# Patient Record
Sex: Female | Born: 1955 | Race: White | Hispanic: No | Marital: Married | Smoking: Never smoker
Health system: Southern US, Community
[De-identification: ages and names within clinical notes are randomized; demographics above are authoritative.]

---

## 2016-10-08 ENCOUNTER — Institutional Professional Consult (permissible substitution): Payer: Self-pay | Admitting: Internal Medicine

## 2016-10-17 ENCOUNTER — Other Ambulatory Visit (INDEPENDENT_AMBULATORY_CARE_PROVIDER_SITE_OTHER): Payer: Commercial Managed Care - PPO

## 2016-10-17 ENCOUNTER — Ambulatory Visit (INDEPENDENT_AMBULATORY_CARE_PROVIDER_SITE_OTHER): Payer: Commercial Managed Care - PPO | Admitting: Internal Medicine

## 2016-10-17 ENCOUNTER — Encounter: Payer: Self-pay | Admitting: Internal Medicine

## 2016-10-17 VITALS — BP 116/74 | HR 80 | Temp 98.6°F | Ht 68.0 in | Wt 203.0 lb

## 2016-10-17 DIAGNOSIS — J45991 Cough variant asthma: Secondary | ICD-10-CM

## 2016-10-17 LAB — CBC WITH DIFFERENTIAL/PLATELET
BASOS PCT: 0.8 % (ref 0.0–3.0)
Basophils Absolute: 0.1 10*3/uL (ref 0.0–0.1)
EOS PCT: 2.4 % (ref 0.0–5.0)
Eosinophils Absolute: 0.2 10*3/uL (ref 0.0–0.7)
HEMATOCRIT: 39.5 % (ref 36.0–46.0)
HEMOGLOBIN: 13.4 g/dL (ref 12.0–15.0)
LYMPHS PCT: 24.1 % (ref 12.0–46.0)
Lymphs Abs: 2.4 10*3/uL (ref 0.7–4.0)
MCHC: 34 g/dL (ref 30.0–36.0)
MCV: 89.8 fl (ref 78.0–100.0)
Monocytes Absolute: 0.7 10*3/uL (ref 0.1–1.0)
Monocytes Relative: 7.2 % (ref 3.0–12.0)
Neutro Abs: 6.6 10*3/uL (ref 1.4–7.7)
Neutrophils Relative %: 65.5 % (ref 43.0–77.0)
Platelets: 290 10*3/uL (ref 150.0–400.0)
RBC: 4.39 Mil/uL (ref 3.87–5.11)
RDW: 13.3 % (ref 11.5–15.5)
WBC: 10 10*3/uL (ref 4.0–10.5)

## 2016-10-17 MED ORDER — FAMOTIDINE 20 MG PO TABS: ORAL_TABLET | ORAL | Status: DC

## 2016-10-17 MED ORDER — PREDNISONE 10 MG PO TABS: ORAL_TABLET | ORAL | 0 refills | Status: DC

## 2016-10-17 MED ORDER — TRAMADOL HCL 50 MG PO TABS: ORAL_TABLET | ORAL | 0 refills | Status: DC

## 2016-10-17 MED ORDER — PANTOPRAZOLE SODIUM 40 MG PO TBEC: 40.0000 mg | DELAYED_RELEASE_TABLET | Freq: Every day | ORAL | 2 refills | Status: DC

## 2016-10-17 MED ORDER — AMOXICILLIN-POT CLAVULANATE 875-125 MG PO TABS: 1.0000 | ORAL_TABLET | Freq: Two times a day (BID) | ORAL | 0 refills | Status: AC

## 2016-10-17 NOTE — Patient Instructions (Addendum)
Augmentin 875 mg take one pill twice daily  X 10 days - take at breakfast and supper with large glass of water.  It would help reduce the usual side effects (diarrhea and yeast infections) if you ate cultured yogurt at lunch.   The key to effective treatment for your cough is eliminating the non-stop cycle of cough you're stuck in long enough to let your airway heal completely and then see if there is anything still making you cough once you stop the cough suppression, but this should take no more than 5 days to figure out  First take mucinex dm 1200 mg  every 12 hours and supplement if needed with  tramadol 50 mg up to 2 every 4 hours to suppress the urge to cough at all or even clear your throat. Swallowing water or using ice chips/non mint and menthol containing candies (such as lifesavers or sugarless jolly ranchers) are also effective.  You should rest your voice and avoid activities that you know make you cough.  Once you have eliminated the cough for 3 straight days try reducing the tramadol first,  then the mucinex dm as tolerated.    Prednisone 10 mg take  4 each am x 2 days,   2 each am x 2 days,  1 each am x 2 days and stop (this is to eliminate allergies and inflammation from coughing)  Protonix (pantoprazole) Take 30-60 min before first meal of the day and Pepcid 20 mg one bedtime plus chlorpheniramine 4 mg x 2 at bedtime (both available over the counter)  until cough is completely gone for at least a week without the need for cough suppression   Prednisone 10 mg take  4 each am x 2 days,   2 each am x 2 days,  1 each am x 2 days and stop   GERD (REFLUX)  is an extremely common cause of respiratory symptoms, many times with no significant heartburn at all.    It can be treated with medication, but also with lifestyle changes including avoidance of late meals, excessive alcohol, smoking cessation, and avoid fatty foods, chocolate, peppermint, colas, red wine, and acidic juices such as  orange juice.  NO MINT OR MENTHOL PRODUCTS SO NO COUGH DROPS   USE HARD CANDY INSTEAD (jolley ranchers or Stover's or Lifesavers (all available in sugarless versions) NO OIL BASED VITAMINS - use powdered substitutes.   Please remember to go to the lab department downstairs in the basement  for your tests - we will call you with the results when they are available.   Please schedule a follow up office visit in 2 weeks, sooner if needed  with all medications /inhalers/ solutions in hand so we can verify exactly what you are taking. This includes all medications from all doctors and over the counters

## 2016-10-17 NOTE — Progress Notes (Signed)
Subjective:     Patient ID: Alyssa [name], female   DOB: 01/11/1955,     MRN: [id number]  HPI  6 yowf never smoker  remembers having to make trips to ER for treatments for breathing treatments, did not see specialist and did fine in HS swimming/ boot camp s inhalers with new problems with stuffy head esp winter in mid 90's come and go and end up with bad coughing  Most years and started up again Spring 2018 and persisted ever since so referred to pulmonary clinic 10/17/2016 by Dr   Clint Guy     10/17/2016 1st  Pulmonary office visit/ Alyssa [name]   Chief Complaint  Patient presents with  . Pulmonary Consult    Referred by Dr. Leonor Liv. Pt c/o cough since April 2018- prod with dark green sputum, occ with blood streaked. Cough "comes in spells" and can be triggered by turning over in bed. She coughs until loses urinary continence and also until she is vomiting at times.   cough came on acutely in April 2018 present daily since and worse mid morning, some better in late afternoon and best rx was neb in office and multiple ov's and gets severe to point where coughs/gags/pees and sleeps on a couple of pillows on back Onset was when increased exp to father's house as of March 2018  Nose is stuffy/ congested and clear mucus > no allergy/ sinus eval since onset / no resp to abx/ symbicort   Unless coughing Not limited by breathing from desired activities     No obvious day to day or daytime variability or assoc excess/ purulent sputum or mucus plugs or hemoptysis or cp or chest tightness, subjective wheeze or overt   hb symptoms. No unusual exp hx or h/o childhood pna or knowledge of premature birth.  Sleeping ok flat without nocturnal  or early am exacerbation  of respiratory  c/o's or need for noct saba. Also denies any obvious fluctuation of symptoms with weather or environmental changes or other aggravating or alleviating factors except as outlined above   Current Allergies, Complete Past Medical  History, Past Surgical History, Family History, and Social History were reviewed in [employer] record.  ROS  The following are not active complaints unless bolded Hoarseness, sore throat, dysphagia, dental problems, itching, sneezing,  nasal congestion or discharge of excess mucus or purulent secretions, ear ache,   fever, chills, sweats, unintended wt loss or wt gain, classically pleuritic or exertional cp,  orthopnea pnd or leg swelling, presyncope, palpitations, abdominal pain, anorexia, nausea, vomiting, diarrhea  or change in bowel habits or change in bladder habits= incont with cough , change in stools or change in urine, dysuria, hematuria,  rash, arthralgias, visual complaints, headache, numbness, weakness or ataxia or problems with walking or coordination,  change in mood/affect or memory.        Current Meds  Medication Sig  . atenolol (TENORMIN) 25 MG tablet Take 25 mg by mouth daily.  Marland Kitchen atorvastatin (LIPITOR) 80 MG tablet Take 80 mg by mouth daily.  . hydrochlorothiazide (HYDRODIURIL) 25 MG tablet Take 25 mg by mouth daily.  Marland Kitchen loratadine (CLARITIN) 10 MG tablet Take 10 mg by mouth daily.             Review of Systems     Objective:   Physical Exam    amb pleasant hoarse wf nad   Wt Readings from Last 3 Encounters:  10/17/16 203 lb (92.1 kg)    Vital signs  reviewed  - Note on arrival 02 sats  96% on RA     HEENT: nl dentition,  and oropharynx. Nl external ear canals without cough reflex - moderate bilateral non-specific turbinate edema     NECK :  without JVD/Nodes/TM/ nl carotid upstrokes bilaterally   LUNGS: no acc muscle use,  Nl contour chest with severe coughing paroxysms on exp > insp  And mostly pseudowheezing    CV:  RRR  no s3 or murmur or increase in P2, and no edema   ABD:  soft and nontender with nl inspiratory excursion in the supine position. No bruits or organomegaly appreciated, bowel sounds nl  MS:  Nl gait/ ext warm  without deformities, calf tenderness, cyanosis or clubbing No obvious joint restrictions   SKIN: warm and dry without lesions    NEURO:  alert, approp, nl sensorium with  no motor or cerebellar deficits apparent.      I personally reviewed images and agree with radiology impression as follows:  CXR:   08/19/16  wnl    Labs ordered 10/17/2016  Allergy profile   Assessment:

## 2016-10-17 NOTE — Assessment & Plan Note (Addendum)
The most common causes of chronic cough in immunocompetent adults include the following: upper airway cough syndrome (UACS), previously referred to as postnasal drip syndrome (PNDS), which is caused by variety of rhinosinus conditions; (2) asthma; (3) GERD; (4) chronic bronchitis from cigarette smoking or other inhaled environmental irritants; (5) nonasthmatic eosinophilic bronchitis; and (6) bronchiectasis.   These conditions, singly or in combination, have accounted for up to 94% of the causes of chronic cough in prospective studies.   Other conditions have constituted no >6% of the causes in prospective studies These have included bronchogenic carcinoma, chronic interstitial pneumonia, sarcoidosis, left ventricular failure, ACEI-induced cough, and aspiration from a condition associated with pharyngeal dysfunction.    Chronic cough is often simultaneously caused by more than one condition. A single cause has been found from 38 to 82% of the time, multiple causes from 18 to 62%. Multiply caused cough has been the result of three diseases up to 42% of the time.       Most likely this is either cough variant asthma and/or Upper airway cough syndrome (previously labeled PNDS),  is so named because it's frequently impossible to sort out how much is  CR/sinusitis with freq throat clearing (which can be related to primary GERD)   vs  causing  secondary (" extra esophageal")  GERD from wide swings in gastric pressure that occur with throat clearing, often  promoting self use of mint and menthol lozenges that reduce the lower esophageal sphincter tone and exacerbate the problem further in a cyclical fashion.   These are the same pts (now being labeled as having "irritable larynx syndrome" by some cough centers) who not infrequently have a history of having failed to tolerate ace inhibitors,  dry powder inhalers or biphosphonates or report having atypical/extraesophageal reflux symptoms that don't respond to  standard doses of PPI  and are easily confused as having aecopd or asthma flares by even experienced allergists/ pulmonologists (myself included).   Of the three most common causes of  Sub-acute or recurrent or chronic cough, only one (GERD)  can actually contribute to/ trigger  the other two (asthma and post nasal drip syndrome)  and perpetuate the cylce of cough.  While not intuitively obvious, many patients with chronic low grade reflux do not cough until there is a primary insult that disturbs the protective epithelial barrier and exposes sensitive nerve endings.   This is typically viral but can be direct physical injury such as with an endotracheal tube.   The point is that once this occurs, it is difficult to eliminate the cycle  using anything but a maximally effective acid suppression regimen at least in the short run, accompanied by an appropriate diet to address non acid GERD and control / eliminate the cough itself for at least 3 days.     REC  tramadol to eliminate the cyclical cough/ gerd while rx possible sinus dz with augmentin and control airways inflammation with prednisone and return in 2 weeks to regroup with all meds in hand using a trust but verify approach to confirm accurate Medication  Reconciliation The principal here is that until we are certain that the  patients are doing what we've asked, it makes no sense to ask them to do more.    Total time devoted to counseling  > 50 % of initial 60 min office visit:  review case with pt/ discussion of options/alternatives/ personally creating written customized instructions  in presence of pt  then going over those specific  Instructions directly with the pt including how to use all of the meds but in particular covering each new medication in detail and the difference between the maintenance= "automatic" meds and the prns using an action plan format for the latter (If this problem/symptom => do that organization reading Left to  right).  Please see AVS from this visit for a full list of these instructions which I personally wrote for this pt and  are unique to this visit.

## 2016-10-18 LAB — RESPIRATORY ALLERGY PROFILE REGION II ~~LOC~~
Allergen, Cottonwood, t14: <0.1 kU/L
Allergen, D pternoyssinus,d7: <0.1 kU/L
Allergen, Mouse Urine Protein, e78: <0.1 kU/L
Allergen, Oak,t7: <0.1 kU/L
Allergen, P. notatum, m1: <0.1 kU/L
Bermuda Grass: <0.1 kU/L
Box Elder IgE: <0.1 kU/L
CLASS: 0
CLASS: 0
CLASS: 0
CLASS: 0
CLASS: 0
CLASS: 0
CLASS: 0
CLASS: 0
CLASS: 0
Class: 0
Class: 0
Class: 0
Class: 0
Class: 0
Class: 0
Class: 0
Class: 0
Class: 0
Class: 0
Class: 0
Class: 0
Class: 0
Class: 0
Class: 0
D. farinae: <0.1 kU/L
Dog Dander: <0.1 kU/L
Elm IgE: <0.1 kU/L
IgE (Immunoglobulin E), Serum: 11 kU/L (ref <=114)
Johnson Grass: <0.1 kU/L
Sheep Sorrel IgE: <0.1 kU/L
Timothy Grass: <0.1 kU/L

## 2016-10-18 LAB — INTERPRETATION:

## 2016-10-21 NOTE — Progress Notes (Signed)
Spoke with pt and notified of results per Dr. Wert. Pt verbalized understanding and denied any questions. 

## 2016-11-05 ENCOUNTER — Ambulatory Visit (INDEPENDENT_AMBULATORY_CARE_PROVIDER_SITE_OTHER): Payer: Commercial Managed Care - PPO | Admitting: Internal Medicine

## 2016-11-05 ENCOUNTER — Encounter: Payer: Self-pay | Admitting: Internal Medicine

## 2016-11-05 VITALS — BP 118/76 | HR 77 | Ht 67.0 in | Wt 194.0 lb

## 2016-11-05 DIAGNOSIS — R05 Cough: Secondary | ICD-10-CM

## 2016-11-05 DIAGNOSIS — J45991 Cough variant asthma: Secondary | ICD-10-CM

## 2016-11-05 DIAGNOSIS — R059 Cough, unspecified: Secondary | ICD-10-CM

## 2016-11-05 DIAGNOSIS — I10 Essential (primary) hypertension: Secondary | ICD-10-CM | POA: Insufficient documentation

## 2016-11-05 LAB — POCT EXHALED NITRIC OXIDE: FENO LEVEL (PPB): 54

## 2016-11-05 MED ORDER — BUDESONIDE-FORMOTEROL FUMARATE 80-4.5 MCG/ACT IN AERO: INHALATION_SPRAY | RESPIRATORY_TRACT | 11 refills | Status: DC

## 2016-11-05 MED ORDER — BUDESONIDE-FORMOTEROL FUMARATE 80-4.5 MCG/ACT IN AERO: 2.0000 | INHALATION_SPRAY | Freq: Two times a day (BID) | RESPIRATORY_TRACT | 0 refills | Status: DC

## 2016-11-05 MED ORDER — BISOPROLOL FUMARATE 5 MG PO TABS: 5.0000 mg | ORAL_TABLET | Freq: Every day | ORAL | 11 refills | Status: DC

## 2016-11-05 NOTE — Patient Instructions (Addendum)
Please see patient coordinator before you leave today  to schedule sinus ct and I will call you with results   symbicort 80 Take 2 puffs first thing in am and then another 2 puffs about 12 hours later.    Work on inhaler technique:  relax and gently blow all the way out then take a nice smooth deep breath back in, triggering the inhaler at same time you start breathing in.  Hold for up to 5 seconds if you can. Blow out thru nose. Rinse and gargle with water when done  Stop tenormin and take bisoprolol 5 mg daily in its place - if too strong change bisoprolol to one half daily      Continue mucinex dm up to 1200 mg every 12 hours as needed for cough   Please schedule a follow up office visit in 4 weeks, sooner if needed

## 2016-11-05 NOTE — Progress Notes (Signed)
Subjective:     Patient ID: Alyssa [name], female   DOB: 06-13-1955,     MRN: [id number]    Brief patient profile:  72 yowf never smoker  remembers having to make trips to ER for treatments for breathing treatments, did not see specialist and did fine in HS swimming/ boot camp s inhalers with new problems with stuffy head esp winter in mid 90's come and go and end up with bad coughing  Most years and started up again Spring 2018 and persisted ever since so referred to pulmonary clinic 10/17/2016 by Dr   Clint Guy    History of Present Illness  10/17/2016 1st  Pulmonary office visit/ Alyssa [name]   Chief Complaint  Patient presents with  . Pulmonary Consult    Referred by Dr. Leonor Liv. Pt c/o cough since April 2018- prod with dark green sputum, occ with blood streaked. Cough "comes in spells" and can be triggered by turning over in bed. She coughs until loses urinary continence and also until she is vomiting at times.   cough came on acutely in April 2018 present daily since and worse mid morning, some better in late afternoon and best rx was neb in office and multiple ov's and gets severe to point where coughs/gags/pees and sleeps on a couple of pillows on back Onset was when increased exp to father's house as of March 2018 p mother passed  Nose is stuffy/ congested and clear mucus > no allergy/ sinus eval since onset / no resp to abx/ symbicort  Unless coughing Not limited by breathing from desired activities rec Augmentin 875 mg take one pill twice daily  X 10 days - take at breakfast and supper with large glass of water.  It would help reduce the usual side effects (diarrhea and yeast infections) if you ate cultured yogurt at lunch.  First take mucinex dm 1200 mg  every 12 hours and supplement if needed with  tramadol 50 mg up to 2 every 4 hours to suppress the urge to cough at all or even clear your throat.  Prednisone 10 mg take  4 each am x 2 days,   2 each am x 2 days,  1 each am x 2 days and  stop (this is to eliminate allergies and inflammation from coughing) Protonix (pantoprazole) Take 30-60 min before first meal of the day and Pepcid 20 mg one bedtime plus chlorpheniramine 4 mg x 2 at bedtime (both available over the counter)  until cough is completely gone for at least a week without the need for cough suppression Prednisone 10 mg take  4 each am x 2 days,   2 each am x 2 days,  1 each am x 2 days and stop  GERD diet  Please remember to go to the lab department downstairs in the basement  for your tests - we will call you with the results when they are available. Please schedule a follow up office visit in 2 weeks, sooner if needed  with all medications /inhalers/ solutions in hand so we can verify exactly what you are taking. This includes all medications from all doctors and over the counters    11/05/2016  f/u ov/Alyssa [name] re: cough since spring 2018 with neg allergy profle 10/17/16  Chief Complaint  Patient presents with  . Follow-up    Pt has productive cough- light yellow thinner mucus.    better by around 75% at peak but now losing ground esp mid morning p bfast cough Sleeps ok  But some coughing at hs/only using h1 at hs not daytime with persistent daily sense of excess pnd      Kouffman Reflux v Neurogenic Cough Differentiator Reflux Comments  Do you awaken from a sound sleep coughing violently?                            With trouble breathing? Not now Not now   Do you have choking episodes when you cannot  Get enough air, gasping for air ?              Not now   Do you usually cough when you lie down into  The bed, or when you just lie down to rest ?                          Yes/ transient and mild    Do you usually cough after meals or eating?         Yes  p bfast   Do you cough when (or after) you bend over?    Not now   GERD SCORE     Kouffman Reflux v Neurogenic Cough Differentiator Neurogenic   Do you more-or-less cough all day long? Sporadic now   Does  change of temperature make you cough? No    Does laughing or chuckling cause you to cough? Some still   Do fumes (perfume, automobile fumes, burned  Toast, etc.,) cause you to cough ?      yes   Does speaking, singing, or talking on the phone cause you to cough   ?               some   Neurogenic/Airway score           Not limited by breathing from desired activities     No obvious day to day or daytime variability or assoc excess/ purulent sputum or mucus plugs or hemoptysis or cp or chest tightness, subjective wheeze or overt sinus or hb symptoms. No unusual exp hx or h/o childhood pna/ asthma or knowledge of premature birth.  Sleeping ok flat without nocturnal  or early am exacerbation  of respiratory  c/o's or need for noct saba. Also denies any obvious fluctuation of symptoms with weather or environmental changes or other aggravating or alleviating factors except as outlined above   Current Allergies, Complete Past Medical History, Past Surgical History, Family History, and Social History were reviewed in [facility] electronic medical record.  ROS  The following are not active complaints unless bolded Hoarseness, sore throat, dysphagia, dental problems, itching, sneezing,  nasal congestion or discharge of excess mucus or purulent secretions, ear ache,   fever, chills, sweats, unintended wt loss or wt gain, classically pleuritic or exertional cp,  orthopnea pnd or leg swelling, presyncope, palpitations, abdominal pain, anorexia, nausea, vomiting, diarrhea  or change in bowel habits or change in bladder habits, change in stools or change in urine, dysuria, hematuria,  rash, arthralgias, visual complaints, headache, numbness, weakness or ataxia or problems with walking or coordination,  change in mood/affect or memory.        Current Meds  Medication Sig  . atorvastatin (LIPITOR) 80 MG tablet Take 80 mg by mouth daily.  . famotidine (PEPCID) 20 MG tablet One at bedtime  .  hydrochlorothiazide (HYDRODIURIL) 25 MG tablet Take 25 mg by mouth daily.  Marland Kitchen. loratadine (CLARITIN) 10 MG tablet  Take 10 mg by mouth daily.  . pantoprazole (PROTONIX) 40 MG tablet Take 1 tablet (40 mg total) by mouth daily. Take 30-60 min before first meal of the day  . [  atenolol (TENORMIN) 25 MG tablet Take 25 mg by mouth daily.                 Objective:   Physical Exam    amb pleasant hoarse wf nad with freq throat clearing and nasal tone to voice    Wt Readings from Last 3 Encounters:  11/05/16 194 lb (88 kg)  10/17/16 203 lb (92.1 kg)      Vital signs reviewed  - Note on arrival 02 sats  95% on RA     HEENT: nl dentition,  and oropharynx which is pristine. Nl external ear canals without cough reflex - moderate bilateral non-specific turbinate edema     NECK :  without JVD/Nodes/TM/ nl carotid upstrokes bilaterally   LUNGS: no acc muscle use,  Nl contour chest with  A few upper airway rhonchi with cough more on insp  CV:  RRR  no s3 or murmur or increase in P2, and no edema   ABD:  soft and nontender with nl inspiratory excursion in the supine position. No bruits or organomegaly appreciated, bowel sounds nl  MS:  Nl gait/ ext warm without deformities, calf tenderness, cyanosis or clubbing No obvious joint restrictions   SKIN: warm and dry without lesions    NEURO:  alert, approp, nl sensorium with  no motor or cerebellar deficits apparent.      I personally reviewed images and agree with radiology impression as follows:  CXR:   08/19/16  wnl      Assessment:

## 2016-11-05 NOTE — Assessment & Plan Note (Signed)
Strongly prefer in this setting: Bystolic, the most beta -1  selective Beta blocker available in sample form, with bisoprolol the most selective generic choice  on the market.   Try bisoprolol 5 mg daily - ok to reduce to one half if too strong

## 2016-11-05 NOTE — Assessment & Plan Note (Addendum)
Allergy profile 10/17/2016 >  Eos 0.2 /  IgE 11 RAST neg   - FENO 11/05/2016  =   54 s rx - Spirometry 11/05/2016  No def obst though only breathed out x 3 sec - 11/05/2016  After extensive coaching HFA effectiveness =    75% > try symb 80 2bid - Sinus CT 11/05/2016 >>>  Lack of cough resolution on a verified empirical regimen could mean an alternative diagnosis (cough variant asthma vs uacs) , persistence of the disease state (eg sinusitis or bronchiectasis - the former much more likely than latter so rec sinus ct now) , or inadequacy of currently available therapy (eg no medical rx available for non-acid gerd)   Based on response to prev albuterol neb and prednisone, 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   Each maintenance medication was reviewed in detail including most importantly the difference between maintenance and prns and under what circumstances the prns are to be triggered using an action plan format that is not reflected in the computer generated alphabetically organized AVS.    Please see AVS for specific instructions unique to this office visit that I personally wrote and verbalized to the the pt in detail and then reviewed with pt  by my nurse highlighting any changes in therapy/plan of care  recommended at today's visit.

## 2016-11-07 ENCOUNTER — Telehealth: Payer: Self-pay | Admitting: Internal Medicine

## 2016-11-07 NOTE — Telephone Encounter (Signed)
Received a PA request for Symbicort 80. Insurance wants the patient to try and fail Advair HFA or Diskus before they will cover the Symbicort.   MW, please advise if you are wanting to switch the patient to Advair. Thanks!

## 2016-11-08 NOTE — Telephone Encounter (Signed)
Called and spoke with pt and she stated that she has an inhaler with enough to last her until her apptt with MW.  She is aware to call if she starts to run low and we can give her a sample.

## 2016-11-08 NOTE — Telephone Encounter (Signed)
Give her enough samples of symb 80 to last until next ov if she doesn't already have enough and we'll decide then what if any inhalers she needs

## 2016-11-13 ENCOUNTER — Other Ambulatory Visit: Payer: Self-pay | Admitting: Internal Medicine

## 2016-11-13 ENCOUNTER — Ambulatory Visit (INDEPENDENT_AMBULATORY_CARE_PROVIDER_SITE_OTHER)
Admission: RE | Admit: 2016-11-13 | Discharge: 2016-11-13 | Disposition: A | Discharge status: Home / Self Care | Payer: Commercial Managed Care - PPO | Source: Ambulatory Visit | Attending: Internal Medicine | Admitting: Internal Medicine

## 2016-11-13 DIAGNOSIS — J45991 Cough variant asthma: Secondary | ICD-10-CM

## 2016-11-13 DIAGNOSIS — R059 Cough, unspecified: Secondary | ICD-10-CM

## 2016-11-13 DIAGNOSIS — R05 Cough: Secondary | ICD-10-CM | POA: Diagnosis not present

## 2016-11-13 NOTE — Progress Notes (Signed)
Spoke with pt and notified of results per Dr. Wert. Pt verbalized understanding and denied any questions. 

## 2016-11-19 ENCOUNTER — Telehealth: Payer: Self-pay | Admitting: Internal Medicine

## 2016-11-19 NOTE — Telephone Encounter (Signed)
Spoke with pt, she states she does need samples of Symbicort. I will leave samples up front for her to pick up. Nothing further is needed.

## 2016-12-03 ENCOUNTER — Ambulatory Visit (INDEPENDENT_AMBULATORY_CARE_PROVIDER_SITE_OTHER): Payer: Commercial Managed Care - PPO | Admitting: Internal Medicine

## 2016-12-03 ENCOUNTER — Encounter: Payer: Self-pay | Admitting: Internal Medicine

## 2016-12-03 VITALS — BP 108/70 | HR 63 | Ht 67.0 in | Wt 193.2 lb

## 2016-12-03 DIAGNOSIS — J45991 Cough variant asthma: Secondary | ICD-10-CM

## 2016-12-03 DIAGNOSIS — Z23 Encounter for immunization: Secondary | ICD-10-CM

## 2016-12-03 LAB — NITRIC OXIDE: Nitric Oxide: 20

## 2016-12-03 MED ORDER — BUDESONIDE-FORMOTEROL FUMARATE 80-4.5 MCG/ACT IN AERO: INHALATION_SPRAY | RESPIRATORY_TRACT | 0 refills | Status: DC

## 2016-12-03 NOTE — Patient Instructions (Signed)
Continue same  mediations for now - we may be able to modifiy/ simplify once Dr Pollyann Kennedyosen corrects your sinus problems  Work on inhaler technique:  relax and gently blow all the way out then take a nice smooth deep breath back in, triggering the inhaler at same time you start breathing in.  Hold for up to 5 seconds if you can. Blow out thru nose. Rinse and gargle with water when done      Please schedule a follow up visit in 3 months but call sooner if needed

## 2016-12-03 NOTE — Assessment & Plan Note (Addendum)
Allergy profile 10/17/2016 >  Eos 0.2 /  IgE 11 RAST neg   - FENO 11/05/2016  =   54 s rx - Spirometry 11/05/2016  No def obst though only breathed out x 3 sec - 11/05/2016    try symb 80 2bid - Sinus CT 11/13/2016 >>> Advanced pansinusitis.> ent eval  11/19/16 > rx clindamycin x 3 weeks  - FENO 12/03/2016  =   20 on symb 80 2bid  - 12/03/2016  After extensive coaching HFA effectiveness =    90% > continue symb 80 2bid   All goals of chronic asthma control met including optimal function and elimination of symptoms with No  need for rescue therapy at all   Contingencies discussed in full including contacting this office immediately if not controlling the symptoms using the rule of two's.      Needs f/u p completion of ent rx to consider simplifying rx   Each maintenance medication was reviewed in detail including most importantly the difference between maintenance and as needed and under what circumstances the prns are to be used.  Please see AVS for specific  Instructions which are unique to this visit and I personally typed out  which were reviewed in detail in writing with the patient and a copy provided.

## 2016-12-03 NOTE — Progress Notes (Signed)
Subjective:     Patient ID: Alyssa [name], female   DOB: 02/16/1955,     MRN: [id number]    Brief patient profile:  [id number] [id number] never smoker  remembers having to make trips to ER for treatments for breathing treatments as child, did not see specialist and did fine in HS swimming/ boot camp s inhalers with new problems with stuffy head esp winter in mid 90's come and go and end up with bad coughing  Most years and started up again Spring 2018 and persisted ever since so referred to pulmonary clinic 10/17/2016 by Dr   Alyssa [name]    History of Present Illness  10/17/2016 1st [facility] office visit/ Alyssa [name]   Chief Complaint  Patient presents with  . Pulmonary Consult    Referred by Dr. Leonor [name]. Pt c/o cough since April 2018- prod with dark green sputum, occ with blood streaked. Cough "comes in spells" and can be triggered by turning over in bed. She coughs until loses urinary continence and also until she is vomiting at times.   cough came on acutely in April 2018 present daily since and worse mid morning, some better in late afternoon and best rx was neb in office and multiple ov's and gets severe to point where coughs/gags/pees and sleeps on a couple of pillows on back Onset was when increased exp to father's house as of March 2018 p mother passed  Nose is stuffy/ congested and clear mucus > no allergy/ sinus eval since onset / no resp to abx/ symbicort  Unless coughing Not limited by breathing from desired activities rec Augmentin 875 mg take one pill twice daily  X 10 days - take at breakfast and supper with large glass of water.  It would help reduce the usual side effects (diarrhea and yeast infections) if you ate cultured yogurt at lunch.  First take mucinex dm 1200 mg  every 12 hours and supplement if needed with  tramadol 50 mg up to 2 every 4 hours to suppress the urge to cough at all or even clear your throat.  Prednisone 10 mg take  4 each am x 2 days,   2 each am x 2 days,  1 each am x 2  days and stop (this is to eliminate allergies and inflammation from coughing) Protonix (pantoprazole) Take 30-60 min before first meal of the day and Pepcid 20 mg one bedtime plus chlorpheniramine 4 mg x 2 at bedtime (both available over the counter)  until cough is completely gone for at least a week without the need for cough suppression Prednisone 10 mg take  4 each am x 2 days,   2 each am x 2 days,  1 each am x 2 days and stop  GERD diet  Please remember to go to the lab department downstairs in the basement  for your tests - we will call you with the results when they are available. Please schedule a follow up office visit in 2 weeks, sooner if needed  with all medications /inhalers/ solutions in hand so we can verify exactly what you are taking. This includes all medications from all doctors and over the counters    11/05/2016  f/u ov/Alyssa [name] re: cough since spring 2018 with neg allergy profle 10/17/16  Chief Complaint  Patient presents with  . Follow-up    Pt has productive cough- light yellow thinner mucus.    better by around 75% at peak but now losing ground esp mid morning p bfast cough  Sleeps ok  But some coughing at hs/only using h1 at hs not daytime with persistent daily sense of excess pnd   Alyssa [name] Reflux v Neurogenic Cough Differentiator Reflux Comments  Do you awaken from a sound sleep coughing violently?                            With trouble breathing? Not now Not now   Do you have choking episodes when you cannot  Get enough air, gasping for air ?              Not now   Do you usually cough when you lie down into  The bed, or when you just lie down to rest ?                          Yes/ transient and mild    Do you usually cough after meals or eating?         Yes  p bfast   Do you cough when (or after) you bend over?    Not now   GERD SCORE     Alyssa [name] Reflux v Neurogenic Cough Differentiator Neurogenic   Do you more-or-less cough all day long? Sporadic now   Does  change of temperature make you cough? No    Does laughing or chuckling cause you to cough? Some still   Do fumes (perfume, automobile fumes, burned  Toast, etc.,) cause you to cough ?      yes   Does speaking, singing, or talking on the phone cause you to cough   ?               some   Neurogenic/Airway score     Not limited by breathing from desired activities   rec Sinus CT > pos advance pansinusitis > ent eval 11/19/16 = 3 weeks clindamycin then repeat CT ([facility])  Symbicort 80 Take 2 puffs first thing in am and then another 2 puffs about 12 hours later.  Work on inhaler technique:  relax and gently blow all the way out then take a nice smooth deep breath back in, triggering the inhaler at same time you start breathing in.  Hold for up to 5 seconds if you can. Blow out thru nose. 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 Follow-up    Cough is much improved. She denies any new co's today. She is currently on abx ? name- given by ENT.   no cough on awakening then 3 h later at father's house starts coughing some but does use symbicort 80 x 2 on arising  No noct cough at all Not limited by breathing from desired activities   No need for saba at all    No obvious day to day or daytime variability or assoc excess/ purulent sputum or mucus plugs or hemoptysis or cp or chest tightness, subjective wheeze or overt sinus or hb symptoms. No unusual exposure hx or h/o   knowledge of premature birth.  Sleeping ok flat without nocturnal  or early am  exacerbation  of respiratory  c/o's or need for noct saba. Also denies any obvious fluctuation of symptoms with weather or environmental changes or other  aggravating or alleviating factors except as outlined above   Current Allergies, Complete Past Medical History, Past Surgical History, Family History, and Social History were reviewed in [employer] record.  ROS  The following are not active complaints unless bolded Hoarseness, sore throat, dysphagia, dental problems, itching, sneezing,  nasal congestion improved on clinidamycin or discharge of excess mucus or purulent secretions, ear ache,   fever, chills, sweats, unintended wt loss or wt gain, classically pleuritic or exertional cp,  orthopnea pnd or leg swelling, presyncope, palpitations, abdominal pain, anorexia, nausea, vomiting, diarrhea  or change in bowel habits or change in bladder habits, change in stools or change in urine, dysuria, hematuria,  rash, arthralgias, visual complaints, headache, numbness, weakness or ataxia or problems with walking or coordination,  change in mood/affect or memory.        Current Meds  Medication Sig  . atorvastatin (LIPITOR) 80 MG tablet Take 80 mg by mouth daily.  . bisoprolol (ZEBETA) 5 MG tablet Take 1 tablet (5 mg total) by mouth daily.  . budesonide-formoterol (SYMBICORT) 80-4.5 MCG/ACT inhaler Take 2 puffs first thing in am and then another 2 puffs about 12 hours later.  . famotidine (PEPCID) 20 MG tablet One at bedtime  . hydrochlorothiazide (HYDRODIURIL) 25 MG tablet Take 25 mg by mouth daily.  Marland Kitchen loratadine (CLARITIN) 10 MG tablet Take 10 mg by mouth daily.  . pantoprazole (PROTONIX) 40 MG tablet Take 1 tablet (40 mg total) by mouth daily. Take 30-60 min before first meal of the day  . [  budesonide-formoterol (SYMBICORT) 80-4.5 MCG/ACT inhaler Take 2 puffs first thing in am and then another 2 puffs about 12 hours later.                   Objective:   Physical Exam   amb wf / all smiles     Wt Readings from Last 3 Encounters:  12/03/16 193 lb 3.2 oz (87.6 kg)  11/05/16 194 lb (88 kg)  10/17/16 203 lb (92.1 kg)     Vital signs reviewed  - Note on arrival 02 sats  97% on RA             HEENT: nl dentition , and oropharynx. Nl external ear canals without cough reflex - mild to moderate bilateral non-specific turbinate edema     NECK :  without JVD/Nodes/TM/ nl carotid upstrokes bilaterally   LUNGS: no acc muscle use,  Nl contour chest which is clear to A and P bilaterally without cough on insp or exp maneuvers   CV:  RRR  no s3 or murmur or increase in P2, and no edema   ABD:  soft and nontender with nl inspiratory excursion in the supine position. No bruits or organomegaly appreciated, bowel sounds nl  MS:  Nl gait/ ext warm without deformities, calf tenderness, cyanosis or clubbing No obvious joint restrictions   SKIN: warm and dry without lesions    NEURO:  alert, approp, nl sensorium with  no motor or cerebellar deficits apparent.             Assessment:

## 2017-02-14 ENCOUNTER — Telehealth: Payer: Self-pay | Admitting: Internal Medicine

## 2017-02-14 DIAGNOSIS — J45991 Cough variant asthma: Secondary | ICD-10-CM

## 2017-02-14 MED ORDER — FAMOTIDINE 20 MG PO TABS: ORAL_TABLET | ORAL | 2 refills | Status: AC

## 2017-02-14 MED ORDER — PANTOPRAZOLE SODIUM 40 MG PO TBEC: 40.0000 mg | DELAYED_RELEASE_TABLET | Freq: Every day | ORAL | 2 refills | Status: AC

## 2017-02-14 MED ORDER — BISOPROLOL FUMARATE 5 MG PO TABS: 5.0000 mg | ORAL_TABLET | Freq: Every day | ORAL | 2 refills | Status: AC

## 2017-02-14 MED ORDER — BUDESONIDE-FORMOTEROL FUMARATE 80-4.5 MCG/ACT IN AERO: INHALATION_SPRAY | RESPIRATORY_TRACT | 1 refills | Status: AC

## 2017-02-14 NOTE — Telephone Encounter (Signed)
[facility] pt, [facility] I sent her meds to the pharmacy and nothing further is needed.

## 2017-07-26 ENCOUNTER — Other Ambulatory Visit: Payer: Self-pay | Admitting: Internal Medicine

## 2017-07-26 DIAGNOSIS — J45991 Cough variant asthma: Secondary | ICD-10-CM
